# Patient Record
Sex: Female | Born: 2000 | Race: Black or African American | Hispanic: No | Marital: Single | Smoking: Never smoker
Health system: Southern US, Community
[De-identification: ages and names within clinical notes are randomized; demographics above are authoritative.]

## PROBLEM LIST (undated history)

## (undated) ENCOUNTER — Inpatient Hospital Stay (HOSPITAL_COMMUNITY): Payer: Self-pay

## (undated) DIAGNOSIS — Z789 Other specified health status: Secondary | ICD-10-CM

## (undated) HISTORY — PX: NO PAST SURGERIES: SHX2092

## (undated) HISTORY — PX: WISDOM TOOTH EXTRACTION: SHX21

---
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
## 2020-04-13 DIAGNOSIS — Z20822 Contact with and (suspected) exposure to covid-19: Secondary | ICD-10-CM | POA: Diagnosis not present

## 2020-05-08 ENCOUNTER — Other Ambulatory Visit (HOSPITAL_COMMUNITY)
Admission: RE | Admit: 2020-05-08 | Discharge: 2020-05-08 | Disposition: A | Discharge status: Home / Self Care | Payer: Medicaid Other | Source: Ambulatory Visit | Attending: Nurse Practitioner | Admitting: Nurse Practitioner

## 2020-05-08 DIAGNOSIS — N898 Other specified noninflammatory disorders of vagina: Secondary | ICD-10-CM | POA: Diagnosis not present

## 2020-05-08 DIAGNOSIS — N771 Vaginitis, vulvitis and vulvovaginitis in diseases classified elsewhere: Secondary | ICD-10-CM | POA: Diagnosis not present

## 2020-05-08 DIAGNOSIS — Z113 Encounter for screening for infections with a predominantly sexual mode of transmission: Secondary | ICD-10-CM | POA: Diagnosis not present

## 2020-05-08 DIAGNOSIS — Z7189 Other specified counseling: Secondary | ICD-10-CM | POA: Diagnosis not present

## 2020-05-10 LAB — MOLECULAR ANCILLARY ONLY
Bacterial Vaginitis (gardnerella): POSITIVE — AB
Candida Glabrata: NEGATIVE
Candida Vaginitis: POSITIVE — AB
Chlamydia: NEGATIVE
Comment: NEGATIVE
Comment: NEGATIVE
Comment: NEGATIVE
Comment: NEGATIVE
Comment: NEGATIVE
Comment: NORMAL
Neisseria Gonorrhea: NEGATIVE
Trichomonas: NEGATIVE

## 2020-09-13 DIAGNOSIS — K5901 Slow transit constipation: Secondary | ICD-10-CM | POA: Diagnosis not present

## 2020-09-13 DIAGNOSIS — B955 Unspecified streptococcus as the cause of diseases classified elsewhere: Secondary | ICD-10-CM | POA: Diagnosis not present

## 2020-09-13 DIAGNOSIS — L089 Local infection of the skin and subcutaneous tissue, unspecified: Secondary | ICD-10-CM | POA: Diagnosis not present

## 2020-09-13 DIAGNOSIS — L22 Diaper dermatitis: Secondary | ICD-10-CM | POA: Diagnosis not present

## 2020-11-02 DIAGNOSIS — L700 Acne vulgaris: Secondary | ICD-10-CM | POA: Diagnosis not present

## 2020-11-02 DIAGNOSIS — L2084 Intrinsic (allergic) eczema: Secondary | ICD-10-CM | POA: Diagnosis not present

## 2021-02-20 DIAGNOSIS — L2084 Intrinsic (allergic) eczema: Secondary | ICD-10-CM | POA: Diagnosis not present

## 2021-12-14 ENCOUNTER — Encounter (HOSPITAL_COMMUNITY): Payer: Self-pay | Admitting: Emergency Medicine

## 2021-12-14 ENCOUNTER — Emergency Department (HOSPITAL_COMMUNITY)
Admission: EM | Admit: 2021-12-14 | Discharge: 2021-12-14 | Disposition: A | Discharge status: Home / Self Care | Payer: Medicaid Other | Attending: Emergency Medicine | Admitting: Emergency Medicine

## 2021-12-14 ENCOUNTER — Other Ambulatory Visit: Payer: Self-pay

## 2021-12-14 DIAGNOSIS — J029 Acute pharyngitis, unspecified: Secondary | ICD-10-CM

## 2021-12-14 DIAGNOSIS — Z20822 Contact with and (suspected) exposure to covid-19: Secondary | ICD-10-CM | POA: Insufficient documentation

## 2021-12-14 DIAGNOSIS — J069 Acute upper respiratory infection, unspecified: Secondary | ICD-10-CM | POA: Insufficient documentation

## 2021-12-14 DIAGNOSIS — Z91010 Allergy to peanuts: Secondary | ICD-10-CM | POA: Insufficient documentation

## 2021-12-14 LAB — RESP PANEL BY RT-PCR (FLU A&B, COVID) ARPGX2
Influenza A by PCR: NEGATIVE
Influenza B by PCR: NEGATIVE
SARS Coronavirus 2 by RT PCR: NEGATIVE

## 2021-12-14 LAB — GROUP A STREP BY PCR: Group A Strep by PCR: NOT DETECTED

## 2021-12-14 MED ORDER — IBUPROFEN 800 MG PO TABS
800.0000 mg | ORAL_TABLET | Freq: Once | ORAL | Status: AC
  Administered 2021-12-14: 800 mg via ORAL
  Filled 2021-12-14: qty 1

## 2021-12-14 NOTE — ED Provider Notes (Signed)
?[facility]-EMERGENCY DEPT ?Provider Note ? ? ?CSN: [id number] ?Arrival date & time: 12/14/21  1913 ? ?  ? ?History ? ?Chief Complaint  ?Patient presents with  ? Sore Throat  ? Generalized Body Aches  ? Headache  ? Nasal Congestion  ? ? ?Paula Coleman is a 21 y.o. female who presents the emergency department complaining of flulike symptoms for the past week.  Patient states that she has had a headache for about 1 week, with some associated body aches.  Starting yesterday she began having a sore throat and nasal congestion.  She tried Tylenol once this morning.  No other medications.  No fever. ? ? ?Sore Throat ?Associated symptoms include headaches. Pertinent negatives include no chest pain, no abdominal pain and no shortness of breath.  ?Headache ?Associated symptoms: congestion, myalgias and sore throat   ?Associated symptoms: no abdominal pain, no cough, no fever, no nausea and no vomiting   ? ?  ? ?Home Medications ?Prior to Admission medications   ?Medication Sig Start Date End Date Taking? Authorizing Provider  ?betamethasone valerate ointment (VALISONE) 0.1 % Apply 1 application topically 2 (two) times daily. 10/21/17   [provider]  ?hydrocortisone 2.5 % cream Apply 1 application topically as directed. 05/28/18   [provider]  ?Norethindrone-Ethinyl Estradiol-Fe Biphas (LO LOESTRIN FE) 1 MG-10 MCG / 10 MCG tablet Take 1 tablet by mouth daily. 06/30/19   Nugent, Odie Sera, NP  ?   ? ?Allergies    ?Other, Peanut-containing drug products, and Shellfish allergy   ? ?Review of Systems   ?Review of Systems  ?Constitutional:  Negative for appetite change and fever.  ?HENT:  Positive for congestion and sore throat.   ?Respiratory:  Negative for cough and shortness of breath.   ?Cardiovascular:  Negative for chest pain.  ?Gastrointestinal:  Negative for abdominal pain, nausea and vomiting.  ?Genitourinary:  Negative for dysuria.  ?Musculoskeletal:  Positive for myalgias.   ?Neurological:  Positive for headaches.  ?All other systems reviewed and are negative. ? ?Physical Exam ?Updated Vital Signs ?BP 103/88   Pulse 90   Temp 99.4 ?F (37.4 ?C) (Oral)   Resp 16   SpO2 98%  ?Physical Exam ?Vitals and nursing note reviewed.  ?Constitutional:   ?   Appearance: Normal appearance.  ?HENT:  ?   Head: Normocephalic and atraumatic.  ?   Nose: Congestion present.  ?   Mouth/Throat:  ?   Lips: Pink.  ?   Mouth: Mucous membranes are moist.  ?   Pharynx: Oropharynx is clear. Uvula midline. Posterior oropharyngeal erythema present. No pharyngeal swelling, oropharyngeal exudate or uvula swelling.  ?   Tonsils: No tonsillar exudate or tonsillar abscesses.  ?Eyes:  ?   Conjunctiva/sclera: Conjunctivae normal.  ?Cardiovascular:  ?   Rate and Rhythm: Normal rate and regular rhythm.  ?Pulmonary:  ?   Effort: Pulmonary effort is normal. No respiratory distress.  ?   Breath sounds: Normal breath sounds.  ?Abdominal:  ?   General: There is no distension.  ?   Palpations: Abdomen is soft.  ?   Tenderness: There is no abdominal tenderness.  ?Lymphadenopathy:  ?   Cervical: No cervical adenopathy.  ?Skin: ?   General: Skin is warm and dry.  ?Neurological:  ?   General: No focal deficit present.  ?   Mental Status: She is alert.  ? ? ?ED Results / Procedures / Treatments   ?Labs ?(all labs ordered are listed, but  only abnormal results are displayed) ?Labs Reviewed  ?RESP PANEL BY RT-PCR (FLU A&B, COVID) [id number]  ?GROUP A STREP BY PCR  ? ? ?EKG ?None ? ?Radiology ?No results found. ? ?Procedures ?Procedures  ? ? ?Medications Ordered in ED ?Medications  ?ibuprofen (ADVIL) tablet 800 mg (has no administration in time range)  ? ? ?ED Course/ Medical Decision Making/ A&P ?  ?                        ?Medical Decision Making ?This patient is a 21 year old female who presents to the ED for concern of sore throat.  ? ?Differential diagnoses prior to evaluation: ?The emergent differential diagnosis includes, but is not  limited to,  Viral pharyngitis, strep pharyngitis, dental caries/abscess, esophagitis, sinusitis, post nasal drip, reflux, angioedema, RTA/PTA, Ludwig's angina . This is not an exhaustive differential.  ? ?Past Medical History / Co-morbidities: ?None ? ?Physical Exam: ?Physical exam performed. The pertinent findings include: Patient is afebrile, not tachycardic, not hypoxic, no acute distress.  Lying comfortably on exam bed.  HEENT exam normal as above. ? ?Lab Tests/Imaging studies: ?I Ordered, and personally interpreted labs/imaging including [place] panel and group A strep by PCR.  The pertinent results include: Negative COVID, strep, flu.  ?  ?Medications: ?I ordered medication including ibuprofen  for headache.  I have reviewed the patients home medicines and have made adjustments as needed. ?  ?Disposition: ?After consideration of the diagnostic results and the patients response to treatment, I feel that patient not requiring admission or inpatient treatment for symptoms.  I discussed with the patient that her symptoms are likely related to a viral upper respiratory infection.  We will treat symptomatically with over-the-counter medications.  Discussed reasons to return to the emergency department, and the patient is agreeable to the plan.  ? ?Final Clinical Impression(s) / ED Diagnoses ?Final diagnoses:  ?Viral upper respiratory tract infection  ?Sore throat  ? ? ?Rx / DC Orders ?ED Discharge Orders   ? ? None  ? ?  ? ?Portions of this report may have been transcribed using voice recognition software. Every effort was made to ensure accuracy; however, inadvertent computerized transcription errors may be present. ? ?  ?Makilah Dowda T, PA-C ?12/14/21 2033 ? ?  ?Gerhard Munch, MD ?12/14/21 2105 ? ?

## 2021-12-14 NOTE — ED Triage Notes (Signed)
For the past week, the patient has experienced headaches, sore throat, sinus congestion and body aches. Patient denies any sick contacts.  ?

## 2021-12-14 NOTE — Discharge Instructions (Addendum)
You are seen emergency department today for headache, body aches, sore throat. ? ?As we discussed you tested negative for strep, flu, and COVID.  I think that your symptoms are like related to different virus.  We normally treat this with over-the-counter medications like ibuprofen, Tylenol, decongestants like Mucinex. ? ?Please use Tylenol or ibuprofen for pain.  You may use 600 mg ibuprofen every 6 hours or 1000 mg of Tylenol every 6 hours.  You may choose to alternate between the two, this would be most effective. Do not exceed 4000 mg of Tylenol within 24 hours.  Do not exceed 3200 mg ibuprofen within 24 hours. ? ?Make sure if you take a decongestant like Mucinex you're staying very well hydrated.  ? ?Continue to monitor how you're doing and return to the ER for new or worsening symptoms.  ?

## 2022-04-22 ENCOUNTER — Encounter (HOSPITAL_COMMUNITY): Payer: Self-pay

## 2022-04-22 ENCOUNTER — Emergency Department (HOSPITAL_COMMUNITY)
Admission: EM | Admit: 2022-04-22 | Discharge: 2022-04-22 | Disposition: A | Discharge status: Home / Self Care | Payer: Medicaid Other | Attending: Emergency Medicine | Admitting: Emergency Medicine

## 2022-04-22 DIAGNOSIS — M79672 Pain in left foot: Secondary | ICD-10-CM | POA: Insufficient documentation

## 2022-04-22 DIAGNOSIS — M722 Plantar fascial fibromatosis: Secondary | ICD-10-CM

## 2022-04-22 DIAGNOSIS — Z91010 Allergy to peanuts: Secondary | ICD-10-CM | POA: Insufficient documentation

## 2022-04-22 MED ORDER — ACETAMINOPHEN 325 MG PO TABS
650.0000 mg | ORAL_TABLET | Freq: Once | ORAL | Status: AC
  Administered 2022-04-22: 650 mg via ORAL
  Filled 2022-04-22: qty 2

## 2022-04-22 MED ORDER — IBUPROFEN 200 MG PO TABS
400.0000 mg | ORAL_TABLET | Freq: Once | ORAL | Status: AC
  Administered 2022-04-22: 400 mg via ORAL
  Filled 2022-04-22: qty 2

## 2022-04-22 NOTE — ED Triage Notes (Signed)
Pt presents with c/o left foot pain that started in the middle of the night. Pt denies any injury, reports that her foot hurts when she extends it. Ambulatory in triage.

## 2022-04-22 NOTE — Discharge Instructions (Signed)
You were seen in the emergency department for your foot pain.  Your pain is likely due to plantar fasciitis which is an inflammation of the ligament that attaches to your heel to your toes.  You can take Tylenol and Motrin as needed for pain.  You should try to stretch your foot to help stretch out your plantar fascia to help with your pain.  You should wear shoes with good arch support.  You can follow-up with your primary doctor to have your symptoms rechecked and determine if you need any further work-up or evaluation.  You should return to the emergency department if you are unable to walk, you have changes of color of your foot, you have fevers, or if you have any other new or concerning symptoms.

## 2022-04-22 NOTE — ED Provider Notes (Signed)
[facility]-EMERGENCY DEPT Provider Note   CSN: [id number] Arrival date & time: 04/22/22  0802     History  Chief Complaint  Patient presents with   Foot Pain    Paula CROCKER is a 21 y.o. female.  Patient is a 21 year old female with no significant past medical history presenting to the emergency department with left foot pain.  Patient states that she woke up this morning with pain on her left arch of her foot is worse when she stepped on her foot when she got out of bed.  She denies any trauma, falls or injuries, numbness or weakness, rashes or swelling, fevers or chills.  She denies any prior injury.  The history is provided by the patient.  Foot Pain       Home Medications Prior to Admission medications   Medication Sig Start Date End Date Taking? Authorizing Provider  betamethasone valerate ointment (VALISONE) 0.1 % Apply 1 application topically 2 (two) times daily. 10/21/17   [provider]  hydrocortisone 2.5 % cream Apply 1 application topically as directed. 05/28/18   [provider]  Norethindrone-Ethinyl Estradiol-Fe Biphas (LO LOESTRIN FE) 1 MG-10 MCG / 10 MCG tablet Take 1 tablet by mouth daily. 06/30/19   Nugent, Odie Sera, NP      Allergies    Other, Peanut-containing drug products, and Shellfish allergy    Review of Systems   Review of Systems  Physical Exam Updated Vital Signs BP (!) 135/96 (BP Location: Left Arm)   Pulse 100   Temp 98.3 F (36.8 C) (Oral)   Resp 18   LMP 04/05/2022 (Approximate)   SpO2 99%  Physical Exam Vitals and nursing note reviewed.  Constitutional:      General: She is not in acute distress.    Appearance: Normal appearance.  HENT:     Head: Normocephalic and atraumatic.     Nose: Nose normal.     Mouth/Throat:     Mouth: Mucous membranes are moist.     Pharynx: Oropharynx is clear.  Eyes:     Extraocular Movements: Extraocular movements intact.     Conjunctiva/sclera:  Conjunctivae normal.  Cardiovascular:     Pulses: Normal pulses.  Pulmonary:     Effort: Pulmonary effort is normal.  Abdominal:     General: Abdomen is flat.  Musculoskeletal:        General: No swelling or deformity.     Cervical back: Normal range of motion and neck supple.     Comments: No bony tenderness to L foot, full foot and ankle ROM Tenderness to palpation of L medial arch no overlying skin changes, no rash  Skin:    General: Skin is warm and dry.  Neurological:     General: No focal deficit present.     Mental Status: She is alert and oriented to person, place, and time.     Sensory: No sensory deficit.     Motor: No weakness.  Psychiatric:        Mood and Affect: Mood normal.        Behavior: Behavior normal.     ED Results / Procedures / Treatments   Labs (all labs ordered are listed, but only abnormal results are displayed) Labs Reviewed - No data to display  EKG None  Radiology No results found.  Procedures Procedures    Medications Ordered in ED Medications - No data to display  ED Course/ Medical Decision Making/ A&P  Medical Decision Making This patient presents to the ED with chief complaint(s) of left foot pain with no pertinent past medical history which further complicates the presenting complaint. The complaint involves an extensive differential diagnosis and also carries with it a high risk of complications and morbidity.    The differential diagnosis includes patient has tenderness along the medial plantar fascia of left foot making Planter fasciitis likely.  Patient has no trauma or bony tenderness making fracture dislocation unlikely, the patient has no overlying skin changes making cellulitis unlikely, she has no joint tenderness or swelling making septic joint unlikely.  Patient's foot is warm and well-perfused making ischemic limb unlikely  Additional history obtained: N/A  ED Course and  Reassessment: Patient likely has planter fasciitis.  She was given Tylenol and Motrin for pain.  She was given stretches and recommended to wear shoes with good arch support.  She was recommended to follow-up with her primary doctor.  Her questions were answered.  She was discharged in stable condition  Independent labs interpretation:  N/A  Independent visualization of imaging: N/A  Consultation: N/A  Consideration for admission or further workup: Patient has no emergent conditions that require admission at this time, she is stable for discharge home Social Determinants of health: N/A             Final Clinical Impression(s) / ED Diagnoses Final diagnoses:  None    Rx / DC Orders ED Discharge Orders     None         Phoebe Sharps, DO 04/22/22 [phone]

## 2022-12-18 NOTE — Progress Notes (Deleted)
  CC: Establishment of Care  HPI:  Paula Coleman is a 22 y.o. female with a without significant past medical hx and presenting to the clinic today for establishment of care. Please see problem based assessment and plan for additional details.  Past Medical History:  Diagnosis Date   Medical history non-contributory      Current Outpatient Medications (Endocrine & Metabolic):    Norethindrone-Ethinyl Estradiol-Fe Biphas (LO LOESTRIN FE) 1 MG-10 MCG / 10 MCG tablet, Take 1 tablet by mouth daily.      Current Outpatient Medications (Other):    betamethasone valerate ointment (VALISONE) 0.1 %, Apply 1 application topically 2 (two) times daily.   hydrocortisone 2.5 % cream, Apply 1 application topically as directed.  Family History  Problem Relation Age of Onset   High Cholesterol Father    Glaucoma Father    Hypertension Maternal Grandmother    Prostate cancer Maternal Grandfather    Hypertension Paternal Grandmother    Thyroid disease Paternal Grandfather    Hypertension Paternal Grandfather     Social History: ***  Review of Systems: ROS negative except for what is noted on the assessment and plan.  There were no vitals filed for this visit.   Physical Exam: General: Well appearing, NAD HENT: normocephalic, atraumatic EYES: conjunctiva non-erythematous, no scleral icterus CV: regular rate, normal rhythm, no murmurs, rubs, gallops. Pulmonary: normal work of breathing on RA, lungs clear to auscultation, no rales, wheezes, rhonchi Abdominal: non-distended, soft, non-tender to palpation, normal BS Skin: Warm and dry, no rashes or lesions Neurological: MS: awake, alert and oriented x3, normal speech and fund of knowledge Motor: moves all extremities antigravity Psych: normal affect    Assessment & Plan:   No problem-specific Assessment & Plan notes found for this encounter.   See Encounters Tab for problem based charting.  Patient discussed with Dr.  {NAMES:[id number]::"Guilloud","Hoffman","Mullen","Narendra","Vincent","Machen","Lau","Hatcher"} Gwenevere Abbot, MD Eligha Bridegroom. [facility] Internal Medicine Residency, PGY-2

## 2022-12-19 ENCOUNTER — Ambulatory Visit: Payer: Medicaid Other | Admitting: Internal Medicine

## 2022-12-27 ENCOUNTER — Ambulatory Visit: Payer: Medicaid Other

## 2022-12-31 ENCOUNTER — Ambulatory Visit: Payer: Medicaid Other | Admitting: Student

## 2023-07-03 ENCOUNTER — Other Ambulatory Visit (HOSPITAL_COMMUNITY)
Admission: RE | Admit: 2023-07-03 | Discharge: 2023-07-03 | Disposition: A | Discharge status: Home / Self Care | Payer: Medicaid Other | Source: Ambulatory Visit | Attending: Family Medicine | Admitting: Family Medicine

## 2023-07-03 DIAGNOSIS — N898 Other specified noninflammatory disorders of vagina: Secondary | ICD-10-CM | POA: Insufficient documentation

## 2023-07-03 DIAGNOSIS — Z113 Encounter for screening for infections with a predominantly sexual mode of transmission: Secondary | ICD-10-CM | POA: Diagnosis present

## 2023-07-03 DIAGNOSIS — Z124 Encounter for screening for malignant neoplasm of cervix: Secondary | ICD-10-CM | POA: Diagnosis present

## 2023-07-07 LAB — MOLECULAR ANCILLARY ONLY
Bacterial Vaginitis (gardnerella): POSITIVE — AB
Candida Glabrata: NEGATIVE
Candida Vaginitis: NEGATIVE
Chlamydia: NEGATIVE
Comment: NEGATIVE
Comment: NEGATIVE
Comment: NEGATIVE
Comment: NEGATIVE
Comment: NEGATIVE
Comment: NORMAL
Neisseria Gonorrhea: NEGATIVE
Trichomonas: NEGATIVE

## 2023-07-08 LAB — CYTOLOGY - PAP
Comment: NEGATIVE
Comment: NEGATIVE
Diagnosis: NEGATIVE
HSV1: NEGATIVE
HSV2: NEGATIVE
High risk HPV: NEGATIVE

## 2023-10-15 ENCOUNTER — Encounter: Payer: Self-pay | Admitting: Professional Counselor

## 2023-10-15 ENCOUNTER — Ambulatory Visit: Payer: 59 | Admitting: Professional Counselor

## 2023-10-15 DIAGNOSIS — F411 Generalized anxiety disorder: Secondary | ICD-10-CM | POA: Diagnosis not present

## 2023-10-15 DIAGNOSIS — F330 Major depressive disorder, recurrent, mild: Secondary | ICD-10-CM | POA: Diagnosis not present
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
## 2023-11-26 ENCOUNTER — Ambulatory Visit: Admitting: Professional Counselor

## 2023-11-26 ENCOUNTER — Encounter: Payer: Self-pay | Admitting: Professional Counselor

## 2023-11-26 DIAGNOSIS — F330 Major depressive disorder, recurrent, mild: Secondary | ICD-10-CM | POA: Diagnosis not present

## 2023-11-26 DIAGNOSIS — F411 Generalized anxiety disorder: Secondary | ICD-10-CM | POA: Diagnosis not present
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
## 2023-12-05 ENCOUNTER — Ambulatory Visit (INDEPENDENT_AMBULATORY_CARE_PROVIDER_SITE_OTHER): Payer: Medicaid Other | Admitting: Professional Counselor

## 2023-12-05 ENCOUNTER — Encounter: Payer: Self-pay | Admitting: Professional Counselor

## 2023-12-05 DIAGNOSIS — Z0389 Encounter for observation for other suspected diseases and conditions ruled out: Secondary | ICD-10-CM

## 2023-12-05 NOTE — Progress Notes (Signed)
 Patient did not show for scheduled appointment. No message.  Bartolo Darter, [facility]

## 2023-12-17 ENCOUNTER — Encounter: Payer: Self-pay | Admitting: Professional Counselor

## 2023-12-17 ENCOUNTER — Ambulatory Visit (INDEPENDENT_AMBULATORY_CARE_PROVIDER_SITE_OTHER): Payer: 59 | Admitting: Professional Counselor

## 2023-12-17 DIAGNOSIS — F411 Generalized anxiety disorder: Secondary | ICD-10-CM | POA: Diagnosis not present

## 2023-12-17 DIAGNOSIS — F330 Major depressive disorder, recurrent, mild: Secondary | ICD-10-CM | POA: Diagnosis not present
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
## 2024-02-05 ENCOUNTER — Ambulatory Visit: Admitting: Professional Counselor

## 2024-02-05 ENCOUNTER — Encounter: Payer: Self-pay | Admitting: Professional Counselor

## 2024-02-05 DIAGNOSIS — F411 Generalized anxiety disorder: Secondary | ICD-10-CM

## 2024-02-05 DIAGNOSIS — F330 Major depressive disorder, recurrent, mild: Secondary | ICD-10-CM

## 2024-02-05 NOTE — Progress Notes (Signed)
      [facility] Counselor/Therapist Progress Note  Patient ID: Paula Coleman, MRN: [id number],    Date: 02/05/2024  Time Spent: 4:12 PM - 5:15 PM   Treatment Type: Individual Therapy  Reported Symptoms: stress, worries, restlessness, irritability, intermittent low mood, fatigue, interpersonal concerns, phase of life concerns  Mental Status Exam:  Appearance:   Casual     Behavior:  Appropriate, Sharing, and Motivated  Motor:  Normal  Speech/Language:   Clear and Coherent and Normal Rate  Affect:  Appropriate and Congruent  Mood:  normal  Thought process:  normal  Thought content:    WNL  Sensory/Perceptual disturbances:    WNL  Orientation:  oriented to person, place, time/date, and situation  Attention:  Good  Concentration:  Good  Memory:  WNL  Fund of knowledge:   Good  Insight:    Good  Judgment:   Good  Impulse Control:  Good   Risk Assessment: Danger to Self:  No Self-injurious Behavior: No Danger to Others: No Duty to Warn:no Physical Aggression / Violence:No  Access to Firearms a concern: No  Gang Involvement:No   Subjective: Patient presented to session to address concerns of anxiety depression.  She reported mixed progress at this time.  She reported her son to have graduated from kindergarten last week and to be looking for a summer job.  She processed the experience of having been in contact with challenging family members on a limited basis, and she identified continuing to work to limit contact.  She processed the experience of talking to her brother and having told him how she feels with clarity.  Counselor actively listened and affirmed patient proactive efforts and self advocacy.  Patient identified coping skills and counselor reinforced patient strengths.  Interventions: Humanistic/Existential and Insight-Oriented  Diagnosis:   ICD-10-CM   1. Generalized anxiety disorder  F41.1     2. Major depressive disorder, recurrent episode, mild (HCC)  F33.0        Plan: Pt is scheduled for a follow-up; continue process work and developing coping skills. STG between sessions to continue boundary implementation with challenging extended family members, and prioritize self care and restorative time this summer.  [name], [facility]

## 2024-03-05 ENCOUNTER — Ambulatory Visit: Admitting: Professional Counselor

## 2024-03-05 ENCOUNTER — Encounter: Payer: Self-pay | Admitting: Professional Counselor

## 2024-03-05 DIAGNOSIS — F330 Major depressive disorder, recurrent, mild: Secondary | ICD-10-CM

## 2024-03-05 DIAGNOSIS — F411 Generalized anxiety disorder: Secondary | ICD-10-CM | POA: Diagnosis not present
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
## 2024-03-31 ENCOUNTER — Encounter: Payer: Self-pay | Admitting: Professional Counselor

## 2024-03-31 ENCOUNTER — Ambulatory Visit: Admitting: Professional Counselor

## 2024-03-31 DIAGNOSIS — F411 Generalized anxiety disorder: Secondary | ICD-10-CM

## 2024-03-31 DIAGNOSIS — F330 Major depressive disorder, recurrent, mild: Secondary | ICD-10-CM | POA: Diagnosis not present

## 2024-03-31 NOTE — Progress Notes (Signed)
      [facility] Counselor/Therapist Progress Note  Patient ID: Paula Coleman, MRN: [id number],    Date: 03/31/2024  Time Spent: 4:13 PM to 5:09 PM  Treatment Type: Individual Therapy  Reported Symptoms: Sadness, low mood, fatigue, anxiousness, worries, frustration, stress including financial, life transition events, interpersonal concerns  Mental Status Exam:  Appearance:   Casual     Behavior:  Appropriate, Sharing, and Motivated  Motor:  Normal  Speech/Language:   Clear and Coherent and Normal Rate  Affect:  Tearful  Mood:  Sadness  Thought process:  normal  Thought content:    WNL  Sensory/Perceptual disturbances:    WNL  Orientation:  oriented to person, place, time/date, and situation  Attention:  Good  Concentration:  Good  Memory:  WNL  Fund of knowledge:   Good  Insight:    Good  Judgment:   Good  Impulse Control:  Good   Risk Assessment: Danger to Self:  No Self-injurious Behavior: No Danger to Others: No Duty to Warn:no Physical Aggression / Violence:No  Access to Firearms a concern: No  Gang Involvement:No   Subjective: Patient presented to session to address concerns of anxiety and depression.  She reported mixed progress at this time.  She reflected on her social life at this time and having a small friendship circle, Counselor and patient discussed how the health and quality of these relationships was more important to her then a volume of friends.  Patient processed the experience of working to move soon, and finances being challenging at this time with school starting for her son, and support from his father being limited.  Patient reported having started work again as well after summer break.  Patient processed the experience of continued boundary work as relates extended family and her friend group, and counselor assisted patient in reinforcing her sense of her strengths and values, and prioritization of holistic safety including emotional  safety.  Interventions: Solution-Oriented/Positive Psychology, Humanistic/Existential, and Insight-Oriented  Diagnosis:   ICD-10-CM   1. Generalized anxiety disorder  F41.1     2. Major depressive disorder, recurrent episode, mild (HCC)  F33.0       Plan: Patient is scheduled for follow-up; continue process work and developing coping skills.  STG between sessions for patient to continue to exercise holistic safety across spheres of life, including as relates friends and extended family.  Continue to resource positive self affirmations.  [name], [facility]

## 2024-05-06 ENCOUNTER — Ambulatory Visit: Admitting: Professional Counselor

## 2024-05-24 ENCOUNTER — Other Ambulatory Visit (HOSPITAL_COMMUNITY): Admit: 2024-05-24

## 2024-05-24 ENCOUNTER — Other Ambulatory Visit (HOSPITAL_COMMUNITY)
Admission: RE | Admit: 2024-05-24 | Discharge: 2024-05-24 | Disposition: A | Discharge status: Home / Self Care | Source: Ambulatory Visit | Attending: Family Medicine | Admitting: Family Medicine

## 2024-05-24 ENCOUNTER — Other Ambulatory Visit: Payer: Self-pay | Admitting: Family Medicine

## 2024-05-24 DIAGNOSIS — Z124 Encounter for screening for malignant neoplasm of cervix: Secondary | ICD-10-CM | POA: Diagnosis present

## 2024-05-27 LAB — MOLECULAR ANCILLARY ONLY
Bacterial Vaginitis (gardnerella): POSITIVE — AB
Candida Glabrata: NEGATIVE
Candida Vaginitis: NEGATIVE
Chlamydia: NEGATIVE
Comment: NEGATIVE
Comment: NEGATIVE
Comment: NEGATIVE
Comment: NEGATIVE
Comment: NEGATIVE
Comment: NORMAL
Neisseria Gonorrhea: NEGATIVE
Trichomonas: NEGATIVE

## 2024-05-27 LAB — CYTOLOGY - PAP
Adequacy: ABSENT
Diagnosis: NEGATIVE

## 2024-06-02 ENCOUNTER — Ambulatory Visit: Admitting: Professional Counselor

## 2024-09-02 ENCOUNTER — Emergency Department (HOSPITAL_BASED_OUTPATIENT_CLINIC_OR_DEPARTMENT_OTHER)
Admission: EM | Admit: 2024-09-02 | Discharge: 2024-09-02 | Disposition: A | Discharge status: Home / Self Care | Source: Home / Self Care | Attending: Emergency Medicine | Admitting: Emergency Medicine

## 2024-09-02 ENCOUNTER — Other Ambulatory Visit: Payer: Self-pay

## 2024-09-02 ENCOUNTER — Encounter (HOSPITAL_COMMUNITY): Payer: Self-pay

## 2024-09-02 ENCOUNTER — Emergency Department (HOSPITAL_COMMUNITY)
Admission: EM | Admit: 2024-09-02 | Discharge: 2024-09-02 | Disposition: A | Discharge status: Home / Self Care | Attending: Emergency Medicine | Admitting: Emergency Medicine

## 2024-09-02 ENCOUNTER — Encounter (HOSPITAL_BASED_OUTPATIENT_CLINIC_OR_DEPARTMENT_OTHER): Payer: Self-pay | Admitting: Emergency Medicine

## 2024-09-02 DIAGNOSIS — Z5321 Procedure and treatment not carried out due to patient leaving prior to being seen by health care provider: Secondary | ICD-10-CM | POA: Insufficient documentation

## 2024-09-02 DIAGNOSIS — R109 Unspecified abdominal pain: Secondary | ICD-10-CM | POA: Diagnosis present

## 2024-09-02 DIAGNOSIS — R112 Nausea with vomiting, unspecified: Secondary | ICD-10-CM

## 2024-09-02 DIAGNOSIS — R1110 Vomiting, unspecified: Secondary | ICD-10-CM | POA: Diagnosis not present

## 2024-09-02 DIAGNOSIS — M7918 Myalgia, other site: Secondary | ICD-10-CM | POA: Diagnosis not present

## 2024-09-02 DIAGNOSIS — Z91010 Allergy to peanuts: Secondary | ICD-10-CM | POA: Diagnosis not present

## 2024-09-02 DIAGNOSIS — R197 Diarrhea, unspecified: Secondary | ICD-10-CM | POA: Diagnosis not present

## 2024-09-02 LAB — CBC
HCT: 42.1 % (ref 36.0–46.0)
Hemoglobin: 13.2 g/dL (ref 12.0–15.0)
MCH: 27 pg (ref 26.0–34.0)
MCHC: 31.4 g/dL (ref 30.0–36.0)
MCV: 86.1 fL (ref 80.0–100.0)
Platelets: 244 K/uL (ref 150–400)
RBC: 4.89 MIL/uL (ref 3.87–5.11)
RDW: 13.2 % (ref 11.5–15.5)
WBC: 4.3 K/uL (ref 4.0–10.5)
nRBC: 0 % (ref 0.0–0.2)

## 2024-09-02 LAB — URINALYSIS, ROUTINE W REFLEX MICROSCOPIC
Bacteria, UA: NONE SEEN
Bilirubin Urine: NEGATIVE
Glucose, UA: NEGATIVE mg/dL
Ketones, ur: NEGATIVE mg/dL
Leukocytes,Ua: NEGATIVE
Nitrite: NEGATIVE
Protein, ur: 30 mg/dL — AB
Specific Gravity, Urine: 1.033 — ABNORMAL HIGH (ref 1.005–1.030)
pH: 5 (ref 5.0–8.0)

## 2024-09-02 LAB — COMPREHENSIVE METABOLIC PANEL WITH GFR
ALT: 12 U/L (ref 0–44)
AST: 20 U/L (ref 15–41)
Albumin: 4.7 g/dL (ref 3.5–5.0)
Alkaline Phosphatase: 55 U/L (ref 38–126)
Anion gap: 12 (ref 5–15)
BUN: 12 mg/dL (ref 6–20)
CO2: 26 mmol/L (ref 22–32)
Calcium: 9.7 mg/dL (ref 8.9–10.3)
Chloride: 104 mmol/L (ref 98–111)
Creatinine, Ser: 0.71 mg/dL (ref 0.44–1.00)
GFR, Estimated: >60 mL/min
Glucose, Bld: 99 mg/dL (ref 70–99)
Potassium: 3.8 mmol/L (ref 3.5–5.1)
Sodium: 141 mmol/L (ref 135–145)
Total Bilirubin: 0.6 mg/dL (ref 0.0–1.2)
Total Protein: 8.2 g/dL — ABNORMAL HIGH (ref 6.5–8.1)

## 2024-09-02 LAB — HCG, SERUM, QUALITATIVE: Preg, Serum: NEGATIVE

## 2024-09-02 LAB — LIPASE, BLOOD: Lipase: 17 U/L (ref 11–51)

## 2024-09-02 MED ORDER — ONDANSETRON 4 MG PO TBDP: 4.0000 mg | ORAL_TABLET | Freq: Three times a day (TID) | ORAL | 0 refills | Status: AC | PRN

## 2024-09-02 MED ORDER — KETOROLAC TROMETHAMINE 15 MG/ML IJ SOLN
15.0000 mg | Freq: Once | INTRAMUSCULAR | Status: AC
  Administered 2024-09-02: 15 mg via INTRAVENOUS
  Filled 2024-09-02: qty 1

## 2024-09-02 MED ORDER — FAMOTIDINE 20 MG PO TABS: 20.0000 mg | ORAL_TABLET | Freq: Two times a day (BID) | ORAL | 0 refills | Status: AC

## 2024-09-02 MED ORDER — SODIUM CHLORIDE 0.9 % IV BOLUS
1000.0000 mL | Freq: Once | INTRAVENOUS | Status: AC
  Administered 2024-09-02: 1000 mL via INTRAVENOUS

## 2024-09-02 MED ORDER — PANTOPRAZOLE SODIUM 40 MG IV SOLR
40.0000 mg | Freq: Once | INTRAVENOUS | Status: AC
  Administered 2024-09-02: 40 mg via INTRAVENOUS
  Filled 2024-09-02: qty 10

## 2024-09-02 MED ORDER — STERILE WATER FOR INJECTION IJ SOLN
INTRAMUSCULAR | Status: AC
  Administered 2024-09-02: 10 mL
  Filled 2024-09-02: qty 10

## 2024-09-02 MED ORDER — ONDANSETRON HCL 4 MG/2ML IJ SOLN
4.0000 mg | Freq: Once | INTRAMUSCULAR | Status: AC
  Administered 2024-09-02: 4 mg via INTRAVENOUS
  Filled 2024-09-02: qty 2

## 2024-09-02 NOTE — ED Provider Notes (Signed)
 " [place] EMERGENCY DEPARTMENT AT [facility] Provider Note   CSN: [id number] Arrival date & time: 09/02/24  1308     Patient presents with: Emesis   Paula Coleman is a 24 y.o. female.   Patient with no past surgical history presents to the emergency department today for evaluation of nausea, vomiting, and diarrhea.  She reports that about 2 weeks ago she had an episode where she had a tight sensation in her chest, laid down for a bit, vomited, and felt better.  This then reoccurred yesterday and she started having persistent vomiting and loose stools.  Initially vomit was food that she ate and drink, but today vomit has been more green.  She has had infrequent soft stools without blood.  No blood noted in her vomit.  No abdominal pain except when vomiting.  She tried a acid reducer pill but vomited and could not keep it down.  She denies heavy NSAID use.  She reports occasional, not daily, use of alcohol and THC.  Patient was seen at [facility] earlier today, had labs drawn, left without being seen.  She denies suspicious food or water  exposures.  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       Prior to Admission medications  Medication Sig Start Date End Date Taking? Authorizing Provider  betamethasone valerate ointment (VALISONE) 0.1 % Apply 1 application topically 2 (two) times daily. 10/21/17   [provider]  hydrocortisone 2.5 % cream Apply 1 application topically as directed. 05/28/18   [provider]  Norethindrone-Ethinyl Estradiol-Fe Biphas (LO LOESTRIN FE) 1 MG-10 MCG / 10 MCG tablet Take 1 tablet by mouth daily. 06/30/19   Nugent, Nat BRAVO, NP    Allergies: Other, Peanut-containing drug products, and Shellfish allergy    Review of Systems  Updated Vital Signs BP 125/83   Pulse 88   Temp 98.7 F (37.1 C) (Oral)   Resp 18   Ht 5' 7 (1.702 m)   LMP 08/31/2024 (Exact Date)   SpO2 98%   BMI 19.26 kg/m   Physical  Exam Vitals and nursing note reviewed.  Constitutional:      General: She is not in acute distress.    Appearance: She is well-developed.  HENT:     Head: Normocephalic and atraumatic.     Right Ear: External ear normal.     Left Ear: External ear normal.     Nose: Nose normal.  Eyes:     Conjunctiva/sclera: Conjunctivae normal.  Cardiovascular:     Rate and Rhythm: Normal rate and regular rhythm.     Heart sounds: No murmur heard.    Comments: No tachycardia. Pulmonary:     Effort: No respiratory distress.     Breath sounds: No wheezing, rhonchi or rales.     Comments: Lungs are clear to auscultation, speaking in full sentences. Abdominal:     Palpations: Abdomen is soft.     Tenderness: There is no abdominal tenderness. There is no guarding or rebound.     Comments: Abdomen is soft and nontender.  Musculoskeletal:     Cervical back: Normal range of motion and neck supple.     Right lower leg: No edema.     Left lower leg: No edema.  Skin:    General: Skin is warm and dry.     Findings: No rash.  Neurological:     General: No focal deficit present.     Mental Status: She  is alert. Mental status is at baseline.     Motor: No weakness.  Psychiatric:        Mood and Affect: Mood normal.     (all labs ordered are listed, but only abnormal results are displayed) Labs Reviewed - No data to display  EKG: None  Radiology: No results found.   Procedures   Medications Ordered in the ED  sodium chloride  0.9 % bolus 1,000 mL (0 mLs Intravenous Stopped 09/02/24 1527)  ondansetron  (ZOFRAN ) injection 4 mg (4 mg Intravenous Given 09/02/24 1416)  pantoprazole  (PROTONIX ) injection 40 mg (40 mg Intravenous Given 09/02/24 1417)  sterile water  (preservative free) injection (10 mLs  Given 09/02/24 1419)  ketorolac  (TORADOL ) 15 MG/ML injection 15 mg (15 mg Intravenous Given 09/02/24 1544)   ED Course  Patient seen and examined. History obtained directly from patient.  Reviewed labs  performed earlier today at [place] emergency department.  Labs/EKG: CBC with normal white blood cell count and hemoglobin; CMP with protein 8.2 otherwise unremarkable with normal liver and kidney function; lipase was normal; pregnancy negative; UA concentrated, positive hemoglobin on dipstick but microscopy appears negative.  Imaging: None ordered  Medications/Fluids: Ordered IV fluid bolus, IV Zofran , IV Protonix .  Most recent vital signs reviewed and are as follows: BP 125/83   Pulse 88   Temp 98.7 F (37.1 C) (Oral)   Resp 18   Ht 5' 7 (1.702 m)   LMP 08/31/2024 (Exact Date)   SpO2 98%   BMI 19.26 kg/m   Initial impression: Nausea, vomiting and diarrhea.  Reassuring nonfocal abdominal exam.  Labs performed earlier today at around 9 AM, were very reassuring.  Will treat symptoms and reassess.  If patient can hydrate orally, anticipate discharged home with symptomatic control, bland diet, etc.  4:01 PM Reassessment performed. Patient appears stable.  She has tolerated fluids.  She states that she is feeling better.  Reviewed pertinent lab work and imaging with patient at bedside. Questions answered.   Most current vital signs reviewed and are as follows: BP 125/83   Pulse 88   Temp 98.7 F (37.1 C) (Oral)   Resp 18   Ht 5' 7 (1.702 m)   LMP 08/31/2024 (Exact Date)   SpO2 98%   BMI 19.26 kg/m   Plan: Discharge to home.   Prescriptions written for: Famotidine , Zofran   Other home care instructions discussed: Discussed bland diet, slow advancement, avoid NSAIDs if possible temporarily  ED return instructions discussed: The patient was urged to return to the Emergency Department immediately with worsening of current symptoms, worsening abdominal pain, persistent vomiting, blood noted in stools, fever, or any other concerns. The patient verbalized understanding.   Follow-up instructions discussed: Patient encouraged to follow-up with their PCP in 2 days if not  improving.                                    Medical Decision Making Risk Prescription drug management.   For this patient's complaint of abdominal pain, the following conditions were considered on the differential diagnosis: gastritis/PUD, enteritis/duodenitis, appendicitis, cholelithiasis/cholecystitis, cholangitis, pancreatitis, ruptured viscus, colitis, diverticulitis, small/large bowel obstruction, proctitis, cystitis, pyelonephritis, ureteral colic, aortic dissection, aortic aneurysm. In women, ectopic pregnancy, pelvic inflammatory disease, ovarian cysts, and tubo-ovarian abscess were also considered. Atypical chest etiologies were also considered including ACS, PE, and pneumonia.  Lab workup drawn this morning was very reassuring.  Patient was treated with  IV fluids, antiemetics, Toradol  in the ED with improvement in symptoms.  Clinically she looks well.  No focal abdominal pain.  Do not feel that she would benefit from advanced imaging at this time.  Symptoms of onset nausea, vomiting, diarrhea suggestive of enteritis versus gastroenteritis versus gastritis.  Will focus on symptomatic control, H2 blocker, antiemetic.  Return instructions as above.  The patient's vital signs, pertinent lab work and imaging were reviewed and interpreted as discussed in the ED course. Hospitalization was considered for further testing, treatments, or serial exams/observation. However as patient is well-appearing, has a stable exam, and reassuring studies today, I do not feel that they warrant admission at this time. This plan was discussed with the patient who verbalizes agreement and comfort with this plan and seems reliable and able to return to the Emergency Department with worsening or changing symptoms.        Final diagnoses:  Nausea vomiting and diarrhea    ED Discharge Orders          Ordered    famotidine  (PEPCID ) 20 MG tablet  2 times daily        09/02/24 1601    ondansetron   (ZOFRAN -ODT) 4 MG disintegrating tablet  Every 8 hours PRN        09/02/24 1601               Desiderio Chew, PA-C 09/02/24 1603  "

## 2024-09-02 NOTE — ED Triage Notes (Signed)
 Pt arrived POV stating she came from [facility], was there for N/V x24 hrs, labs and urine done there, states she has had constant vomiting for 1 day.

## 2024-09-02 NOTE — ED Notes (Signed)
 Pt able to tolerate oral liquids and states nausea has improved.

## 2024-09-02 NOTE — Discharge Instructions (Signed)
 Please read and follow all provided instructions.  Your diagnoses today include:  1. Nausea vomiting and diarrhea     Tests performed today include: Complete blood cell count: No concerns Complete metabolic panel: Was normal Lipase (pancreas function test): Was normal Urinalysis (urine test): No signs of infection Pregnancy test (urine or blood, in women only): Negative Vital signs. See below for your results today.   Medications prescribed:  Zofran  (ondansetron ) - for nausea and vomiting  Pepcid  (famotidine ) - antihistamine  You can find this medication over-the-counter.   DO NOT exceed:  20mg  Pepcid  every 12 hours  Take any prescribed medications only as directed.  Home care instructions:  Follow any educational materials contained in this packet.  You should rest for the next several days. Keep drinking plenty of fluids and use the medicine for nausea as directed.   Drink clear liquids for the next 24 hours and introduce solid foods slowly after 24 hours using the b.r.a.t. diet (Bananas, Rice, Applesauce, Toast, Yogurt).    Follow-up instructions: Please follow-up with your primary care provider in the next 2 days for further evaluation of your symptoms. If you are not feeling better in 48 hours you may have a condition that is more serious and you need re-evaluation.   Return instructions:  SEEK IMMEDIATE MEDICAL ATTENTION IF: If you have pain that does not go away or becomes severe  A temperature above 101F develops  Repeated vomiting occurs (multiple episodes)  If you have pain that becomes localized to portions of the abdomen. The right side could possibly be appendicitis. In an adult, the left lower portion of the abdomen could be colitis or diverticulitis.  Blood is being passed in stools or vomit (bright red or black tarry stools)  You develop chest pain, difficulty breathing, dizziness or fainting, or become confused, poorly responsive, or inconsolable (young  children) If you have any other emergent concerns regarding your health  Additional Information: Abdominal (belly) pain can be caused by many things. Your caregiver performed an examination and possibly ordered blood/urine tests and imaging (CT scan, x-rays, ultrasound). Many cases can be observed and treated at home after initial evaluation in the emergency department. Even though you are being discharged home, abdominal pain can be unpredictable. Therefore, you need a repeated exam if your pain does not resolve, returns, or worsens. Most patients with abdominal pain don't have to be admitted to the hospital or have surgery, but serious problems like appendicitis and gallbladder attacks can start out as nonspecific pain. Many abdominal conditions cannot be diagnosed in one visit, so follow-up evaluations are very important.  Your vital signs today were: BP 125/83   Pulse 88   Temp 98.7 F (37.1 C) (Oral)   Resp 18   Ht 5' 7 (1.702 m)   LMP 08/31/2024 (Exact Date)   SpO2 98%   BMI 19.26 kg/m  If your blood pressure (bp) was elevated above 135/85 this visit, please have this repeated by your doctor within one month. --------------

## 2024-09-02 NOTE — ED Triage Notes (Signed)
 Pt came in for consistent vomiting that started yesterday. Pt stated she felt like something was stuck in her chest two weeks ago then yesterday morning. Pt vomited the first time and felt fine, but yesterday she did not have any relief. Pt stated she had Pedialyte and an acid controller pill recently with no relief.  Pt also c/o body aches.
